# Patient Record
Sex: Male | Born: 1965
Health system: Southern US, Community
[De-identification: ages and names within clinical notes are randomized; demographics above are authoritative.]

## PROBLEM LIST (undated history)

## (undated) DIAGNOSIS — K219 Gastro-esophageal reflux disease without esophagitis: Secondary | ICD-10-CM

## (undated) DIAGNOSIS — E785 Hyperlipidemia, unspecified: Secondary | ICD-10-CM

## (undated) DIAGNOSIS — I10 Essential (primary) hypertension: Secondary | ICD-10-CM

## (undated) HISTORY — DX: Gastro-esophageal reflux disease without esophagitis: K21.9

## (undated) HISTORY — PX: HAND SURGERY: SHX662

## (undated) HISTORY — PX: APPENDECTOMY: SHX54

## (undated) HISTORY — DX: Hyperlipidemia, unspecified: E78.5

---

## 2005-09-28 ENCOUNTER — Emergency Department (HOSPITAL_COMMUNITY): Admission: EM | Admit: 2005-09-28 | Discharge: 2005-09-28 | Payer: Self-pay | Admitting: Emergency Medicine

## 2008-10-08 ENCOUNTER — Ambulatory Visit (HOSPITAL_COMMUNITY): Admission: RE | Admit: 2008-10-08 | Discharge: 2008-10-08 | Payer: Self-pay | Admitting: Preventative Medicine

## 2012-03-13 ENCOUNTER — Observation Stay (HOSPITAL_COMMUNITY)
Admission: EM | Admit: 2012-03-13 | Discharge: 2012-03-14 | Disposition: A | Discharge status: Home / Self Care | Payer: 59 | Attending: General Surgery | Admitting: General Surgery

## 2012-03-13 ENCOUNTER — Encounter (HOSPITAL_COMMUNITY): Payer: Self-pay

## 2012-03-13 ENCOUNTER — Emergency Department (HOSPITAL_COMMUNITY): Payer: 59

## 2012-03-13 ENCOUNTER — Encounter (HOSPITAL_COMMUNITY): Payer: Self-pay | Admitting: Anesthesiology

## 2012-03-13 ENCOUNTER — Emergency Department (HOSPITAL_COMMUNITY): Payer: 59 | Admitting: Anesthesiology

## 2012-03-13 ENCOUNTER — Encounter (HOSPITAL_COMMUNITY)
Admission: EM | Disposition: A | Discharge status: Home / Self Care | Payer: Self-pay | Source: Home / Self Care | Attending: General Surgery

## 2012-03-13 DIAGNOSIS — K37 Unspecified appendicitis: Secondary | ICD-10-CM

## 2012-03-13 DIAGNOSIS — R112 Nausea with vomiting, unspecified: Secondary | ICD-10-CM | POA: Insufficient documentation

## 2012-03-13 DIAGNOSIS — K3580 Unspecified acute appendicitis: Principal | ICD-10-CM | POA: Insufficient documentation

## 2012-03-13 DIAGNOSIS — R109 Unspecified abdominal pain: Secondary | ICD-10-CM | POA: Insufficient documentation

## 2012-03-13 HISTORY — PX: LAPAROSCOPIC APPENDECTOMY: SHX408

## 2012-03-13 LAB — URINALYSIS, ROUTINE W REFLEX MICROSCOPIC
Glucose, UA: NEGATIVE mg/dL
Hgb urine dipstick: NEGATIVE
Leukocytes, UA: NEGATIVE
Specific Gravity, Urine: >1.03 — ABNORMAL HIGH (ref 1.005–1.030)
Urobilinogen, UA: 0.2 mg/dL (ref 0.0–1.0)

## 2012-03-13 LAB — COMPREHENSIVE METABOLIC PANEL
ALT: 31 U/L (ref 0–53)
AST: 15 U/L (ref 0–37)
Calcium: 9.9 mg/dL (ref 8.4–10.5)
Creatinine, Ser: 0.68 mg/dL (ref 0.50–1.35)
Sodium: 139 mEq/L (ref 135–145)
Total Protein: 8.1 g/dL (ref 6.0–8.3)

## 2012-03-13 LAB — CBC
MCH: 30.1 pg (ref 26.0–34.0)
MCHC: 35.1 g/dL (ref 30.0–36.0)
MCV: 85.9 fL (ref 78.0–100.0)
Platelets: 237 10*3/uL (ref 150–400)
RDW: 12.3 % (ref 11.5–15.5)

## 2012-03-13 LAB — URINE MICROSCOPIC-ADD ON

## 2012-03-13 SURGERY — APPENDECTOMY, LAPAROSCOPIC
Anesthesia: General | Site: Abdomen | Wound class: Contaminated

## 2012-03-13 MED ORDER — SUCCINYLCHOLINE CHLORIDE 20 MG/ML IJ SOLN
INTRAMUSCULAR | Status: DC | PRN
  Administered 2012-03-13: 12 mg via INTRAVENOUS

## 2012-03-13 MED ORDER — HYDROMORPHONE HCL PF 1 MG/ML IJ SOLN
1.0000 mg | Freq: Once | INTRAMUSCULAR | Status: AC
  Administered 2012-03-13: 1 mg via INTRAVENOUS
  Filled 2012-03-13: qty 1

## 2012-03-13 MED ORDER — SODIUM CHLORIDE 0.9 % IV BOLUS (SEPSIS)
1000.0000 mL | Freq: Once | INTRAVENOUS | Status: AC
  Administered 2012-03-13: 1000 mL via INTRAVENOUS

## 2012-03-13 MED ORDER — ONDANSETRON HCL 4 MG/2ML IJ SOLN: 4.0000 mg | Freq: Four times a day (QID) | INTRAMUSCULAR | Status: DC | PRN

## 2012-03-13 MED ORDER — IOHEXOL 300 MG/ML  SOLN
100.0000 mL | Freq: Once | INTRAMUSCULAR | Status: AC | PRN
  Administered 2012-03-13: 100 mL via INTRAVENOUS

## 2012-03-13 MED ORDER — HYDROMORPHONE HCL PF 1 MG/ML IJ SOLN: 1.0000 mg | INTRAMUSCULAR | Status: DC | PRN

## 2012-03-13 MED ORDER — SODIUM CHLORIDE 0.9 % IV SOLN
1.0000 g | INTRAVENOUS | Status: AC
  Administered 2012-03-13: 1 g via INTRAVENOUS

## 2012-03-13 MED ORDER — ROCURONIUM BROMIDE 100 MG/10ML IV SOLN
INTRAVENOUS | Status: DC | PRN
  Administered 2012-03-13: 24 mg via INTRAVENOUS
  Administered 2012-03-13: 6 mg via INTRAVENOUS
  Administered 2012-03-13: 10 mg via INTRAVENOUS

## 2012-03-13 MED ORDER — LACTATED RINGERS IV SOLN
INTRAVENOUS | Status: DC
  Administered 2012-03-14: via INTRAVENOUS

## 2012-03-13 MED ORDER — ENOXAPARIN SODIUM 40 MG/0.4ML ~~LOC~~ SOLN
40.0000 mg | SUBCUTANEOUS | Status: DC
  Administered 2012-03-14: 40 mg via SUBCUTANEOUS
  Filled 2012-03-13: qty 0.4

## 2012-03-13 MED ORDER — ONDANSETRON HCL 4 MG/2ML IJ SOLN
4.0000 mg | Freq: Once | INTRAMUSCULAR | Status: AC
  Administered 2012-03-13: 4 mg via INTRAVENOUS
  Filled 2012-03-13: qty 2

## 2012-03-13 MED ORDER — LACTATED RINGERS IV SOLN
INTRAVENOUS | Status: DC | PRN
  Administered 2012-03-13: 20:00:00 via INTRAVENOUS

## 2012-03-13 MED ORDER — ACETAMINOPHEN 10 MG/ML IV SOLN
1000.0000 mg | Freq: Four times a day (QID) | INTRAVENOUS | Status: DC
  Administered 2012-03-13 – 2012-03-14 (×2): 1000 mg via INTRAVENOUS
  Filled 2012-03-13 (×2): qty 100

## 2012-03-13 MED ORDER — NEOSTIGMINE METHYLSULFATE 1 MG/ML IJ SOLN
INTRAMUSCULAR | Status: DC | PRN
  Administered 2012-03-13: 3 mg via INTRAVENOUS

## 2012-03-13 MED ORDER — BUPIVACAINE HCL (PF) 0.5 % IJ SOLN
INTRAMUSCULAR | Status: DC | PRN
  Administered 2012-03-13: 10 mL

## 2012-03-13 MED ORDER — GLYCOPYRROLATE 0.2 MG/ML IJ SOLN
INTRAMUSCULAR | Status: DC | PRN
  Administered 2012-03-13: .6 mg via INTRAVENOUS

## 2012-03-13 MED ORDER — ONDANSETRON HCL 4 MG PO TABS: 4.0000 mg | ORAL_TABLET | Freq: Four times a day (QID) | ORAL | Status: DC | PRN

## 2012-03-13 MED ORDER — SODIUM CHLORIDE 0.9 % IR SOLN
Status: DC | PRN
  Administered 2012-03-13: 1000 mL

## 2012-03-13 MED ORDER — SODIUM CHLORIDE 0.9 % IV SOLN
INTRAVENOUS | Status: AC
  Administered 2012-03-13: 20:00:00
  Filled 2012-03-13: qty 1

## 2012-03-13 MED ORDER — ONDANSETRON HCL 4 MG/2ML IJ SOLN
INTRAMUSCULAR | Status: DC | PRN
  Administered 2012-03-13: 4 mg via INTRAVENOUS

## 2012-03-13 MED ORDER — LIDOCAINE HCL 1 % IJ SOLN
INTRAMUSCULAR | Status: DC | PRN
  Administered 2012-03-13: 4 mg via INTRADERMAL

## 2012-03-13 MED ORDER — FENTANYL CITRATE 0.05 MG/ML IJ SOLN
INTRAMUSCULAR | Status: DC | PRN
  Administered 2012-03-13 (×5): 50 ug via INTRAVENOUS

## 2012-03-13 MED ORDER — PROPOFOL 10 MG/ML IV BOLUS
INTRAVENOUS | Status: DC | PRN
  Administered 2012-03-13: 150 mg via INTRAVENOUS

## 2012-03-13 SURGICAL SUPPLY — 45 items
BAG HAMPER (MISCELLANEOUS) ×2 IMPLANT
CLOTH BEACON ORANGE TIMEOUT ST (SAFETY) ×2 IMPLANT
COVER LIGHT HANDLE STERIS (MISCELLANEOUS) ×4 IMPLANT
CUTTER ENDO LINEAR 45M (STAPLE) IMPLANT
CUTTER LINEAR ENDO 35 ETS (STAPLE) ×2 IMPLANT
CUTTER LINEAR ENDO 35 ETS TH (STAPLE) IMPLANT
DECANTER SPIKE VIAL GLASS SM (MISCELLANEOUS) IMPLANT
DISSECTOR BLUNT TIP ENDO 5MM (MISCELLANEOUS) IMPLANT
DURAPREP 26ML APPLICATOR (WOUND CARE) ×2 IMPLANT
ELECT REM PT RETURN 9FT ADLT (ELECTROSURGICAL) ×2
ELECTRODE REM PT RTRN 9FT ADLT (ELECTROSURGICAL) ×1 IMPLANT
FILTER SMOKE EVAC LAPAROSHD (FILTER) ×2 IMPLANT
FORMALIN 10 PREFIL 120ML (MISCELLANEOUS) ×2 IMPLANT
GLOVE BIO SURGEON STRL SZ7.5 (GLOVE) ×2 IMPLANT
GLOVE BIOGEL PI IND STRL 7.0 (GLOVE) ×2 IMPLANT
GLOVE BIOGEL PI INDICATOR 7.0 (GLOVE) ×2
GLOVE EXAM NITRILE MD LF STRL (GLOVE) ×2 IMPLANT
GLOVE OPTIFIT SS 6.5 STRL BRWN (GLOVE) ×2 IMPLANT
GOWN STRL REIN XL XLG (GOWN DISPOSABLE) ×6 IMPLANT
INST SET LAPROSCOPIC AP (KITS) ×2 IMPLANT
IV NS IRRIG 3000ML ARTHROMATIC (IV SOLUTION) IMPLANT
KIT ROOM TURNOVER APOR (KITS) ×2 IMPLANT
MANIFOLD NEPTUNE II (INSTRUMENTS) ×2 IMPLANT
NEEDLE INSUFFLATION 14GA 120MM (NEEDLE) ×2 IMPLANT
NS IRRIG 1000ML POUR BTL (IV SOLUTION) ×2 IMPLANT
PACK LAP CHOLE LZT030E (CUSTOM PROCEDURE TRAY) ×2 IMPLANT
PAD ARMBOARD 7.5X6 YLW CONV (MISCELLANEOUS) ×2 IMPLANT
PENCIL HANDSWITCHING (ELECTRODE) ×2 IMPLANT
POUCH SPECIMEN RETRIEVAL 10MM (ENDOMECHANICALS) ×2 IMPLANT
RELOAD /EVU35 (ENDOMECHANICALS) IMPLANT
RELOAD 45 VASCULAR/THIN (ENDOMECHANICALS) IMPLANT
RELOAD CUTTER ETS 35MM STAND (ENDOMECHANICALS) IMPLANT
SCALPEL HARMONIC ACE (MISCELLANEOUS) ×2 IMPLANT
SET BASIN LINEN APH (SET/KITS/TRAYS/PACK) ×2 IMPLANT
SET TUBE IRRIG SUCTION NO TIP (IRRIGATION / IRRIGATOR) IMPLANT
SPONGE GAUZE 2X2 8PLY STRL LF (GAUZE/BANDAGES/DRESSINGS) ×6 IMPLANT
STAPLER VISISTAT (STAPLE) ×2 IMPLANT
SUT VICRYL 0 UR6 27IN ABS (SUTURE) ×2 IMPLANT
TAPE CLOTH SURG 4X10 WHT LF (GAUZE/BANDAGES/DRESSINGS) ×2 IMPLANT
TRAY FOLEY CATH 14FR (SET/KITS/TRAYS/PACK) ×2 IMPLANT
TROCAR Z-THAD FIOS HNDL 12X100 (TROCAR) ×2 IMPLANT
TROCAR Z-THRD FIOS HNDL 11X100 (TROCAR) ×2 IMPLANT
TROCAR Z-THREAD FIOS 5X100MM (TROCAR) ×2 IMPLANT
WARMER LAPAROSCOPE (MISCELLANEOUS) ×2 IMPLANT
YANKAUER SUCT BULB TIP 10FT TU (MISCELLANEOUS) ×2 IMPLANT

## 2012-03-13 NOTE — Transfer of Care (Signed)
Immediate Anesthesia Transfer of Care Note  Patient: Collin Hernandez  Procedure(s) Performed: Procedure(s) (LRB): APPENDECTOMY LAPAROSCOPIC (N/A)  Patient Location: PACU  Anesthesia Type: General  Level of Consciousness: awake, alert  and oriented  Airway & Oxygen Therapy: Patient Spontanous Breathing  Post-op Assessment: Report given to PACU RN  Post vital signs: Reviewed and stable  Complications: No apparent anesthesia complications

## 2012-03-13 NOTE — ED Notes (Signed)
Pt c/o lower abd pain since last night.  Reports vomited x 6 since last night.  Denies diarrhea.  LBM was noon today and was normal per pt.

## 2012-03-13 NOTE — Anesthesia Procedure Notes (Signed)
Procedure Name: Intubation Date/Time: 03/13/2012 8:24 PM Performed by: Glynn Octave E Pre-anesthesia Checklist: Patient identified, Patient being monitored, Timeout performed, Emergency Drugs available and Suction available Patient Re-evaluated:Patient Re-evaluated prior to inductionOxygen Delivery Method: Circle System Utilized Preoxygenation: Pre-oxygenation with 100% oxygen Intubation Type: IV induction, Rapid sequence and Cricoid Pressure applied Ventilation: Mask ventilation without difficulty Laryngoscope Size: Mac and 3 Grade View: Grade I Tube type: Oral Tube size: 7.0 mm Number of attempts: 1 Airway Equipment and Method: stylet Placement Confirmation: ETT inserted through vocal cords under direct vision,  positive ETCO2 and breath sounds checked- equal and bilateral Secured at: 21 cm Tube secured with: Tape Dental Injury: Teeth and Oropharynx as per pre-operative assessment

## 2012-03-13 NOTE — H&P (Signed)
Collin Hernandez is an 46 y.o. male.   Chief Complaint: right lower quadrant abdominal pain HPI: patient is a 46 year old Hispanic male who presents with a less than 24-hour history of worseningright lower quadrant abdominal pain. CT scan the abdomen and pelvis reveals acute appendicitis. Surgery has been consulted.  History reviewed. No pertinent past medical history.  History reviewed. No pertinent past surgical history.  No family history on file. Social History:  reports that he has never smoked. He does not have any smokeless tobacco history on file. He reports that he drinks alcohol. He reports that he does not use illicit drugs.  Allergies: No Known Allergies   (Not in a hospital admission)  Results for orders placed during the hospital encounter of 03/13/12 (from the past 48 hour(s))  CBC     Status: Abnormal   Collection Time   03/13/12  5:40 PM      Component Value Range Comment   WBC 15.9 (*) 4.0 - 10.5 K/uL    RBC 4.81  4.22 - 5.81 MIL/uL    Hemoglobin 14.5  13.0 - 17.0 g/dL    HCT 78.2  95.6 - 21.3 %    MCV 85.9  78.0 - 100.0 fL    MCH 30.1  26.0 - 34.0 pg    MCHC 35.1  30.0 - 36.0 g/dL    RDW 08.6  57.8 - 46.9 %    Platelets 237  150 - 400 K/uL   COMPREHENSIVE METABOLIC PANEL     Status: Abnormal   Collection Time   03/13/12  5:40 PM      Component Value Range Comment   Sodium 139  135 - 145 mEq/L    Potassium 3.9  3.5 - 5.1 mEq/L    Chloride 100  96 - 112 mEq/L    CO2 28  19 - 32 mEq/L    Glucose, Bld 143 (*) 70 - 99 mg/dL    BUN 11  6 - 23 mg/dL    Creatinine, Ser 6.29  0.50 - 1.35 mg/dL    Calcium 9.9  8.4 - 52.8 mg/dL    Total Protein 8.1  6.0 - 8.3 g/dL    Albumin 4.5  3.5 - 5.2 g/dL    AST 15  0 - 37 U/L    ALT 31  0 - 53 U/L    Alkaline Phosphatase 88  39 - 117 U/L    Total Bilirubin 0.8  0.3 - 1.2 mg/dL    GFR calc non Af Amer >90  >90 mL/min    GFR calc Af Amer >90  >90 mL/min   LIPASE, BLOOD     Status: Normal   Collection Time   03/13/12  5:40 PM       Component Value Range Comment   Lipase 28  11 - 59 U/L   URINALYSIS, ROUTINE W REFLEX MICROSCOPIC     Status: Abnormal   Collection Time   03/13/12  6:49 PM      Component Value Range Comment   Color, Urine AMBER (*) YELLOW BIOCHEMICALS MAY BE AFFECTED BY COLOR   APPearance CLEAR  CLEAR    Specific Gravity, Urine >1.030 (*) 1.005 - 1.030    pH 6.0  5.0 - 8.0    Glucose, UA NEGATIVE  NEGATIVE mg/dL    Hgb urine dipstick NEGATIVE  NEGATIVE    Bilirubin Urine NEGATIVE  NEGATIVE    Ketones, ur >80 (*) NEGATIVE mg/dL    Protein, ur TRACE (*) NEGATIVE  mg/dL    Urobilinogen, UA 0.2  0.0 - 1.0 mg/dL    Nitrite NEGATIVE  NEGATIVE    Leukocytes, UA NEGATIVE  NEGATIVE   URINE MICROSCOPIC-ADD ON     Status: Abnormal   Collection Time   03/13/12  6:49 PM      Component Value Range Comment   Squamous Epithelial / LPF RARE  RARE    WBC, UA 0-2  <3 WBC/hpf    RBC / HPF 0-2  <3 RBC/hpf    Bacteria, UA FEW (*) RARE    Ct Abdomen Pelvis W Contrast  03/13/2012  *RADIOLOGY REPORT*  Clinical Data: Right lower quadrant pain.  Chills.  CT ABDOMEN AND PELVIS WITH CONTRAST  Technique:  Multidetector CT imaging of the abdomen and pelvis was performed following the standard protocol during bolus administration of intravenous contrast.  Contrast: OMNIPAQUE IOHEXOL 300 MG/ML  SOLN  Comparison: None.  Findings: Findings consistent with appendicitis with the appendix containing two stones measuring up to 7.4 mm with remainder the appendix dilated with surrounding inflammation.  No free intraperitoneal air. Small amount of free fluid in the pelvis.  No well-defined drainable abscess.  Under distended colon from the level of the distal transverse colon to the rectum.  Evaluation of this area is limited ( with respect to excluding colitis) but without definitive extraluminal bowel inflammatory process referable to this region.  Fatty liver without focal hepatic lesion.  No calcified gallstones.  No focal splenic,  pancreatic, adrenal or renal lesion.  Lung bases clear.  No abdominal aortic aneurysm.  No adenopathy.  No bony destructive lesion.  Decompressed noncontrast filled urinary bladder without gross abnormality.  IMPRESSION: Findings consistent with appendicitis as detailed above.  Critical Value/emergent results were called by telephone at the time of interpretation on 03/13/2012 at 6:20 p.m. to Dr. Patria Mane, who verbally acknowledged these results.  Original Report Authenticated By: Fuller Canada, M.D.    Review of Systems  Constitutional: Positive for malaise/fatigue.  HENT: Negative.   Eyes: Negative.   Respiratory: Negative.   Cardiovascular: Negative.   Gastrointestinal: Positive for nausea and abdominal pain.  Genitourinary: Negative.   Musculoskeletal: Negative.   Skin: Negative.   Neurological: Negative.   Endo/Heme/Allergies: Negative.   Psychiatric/Behavioral: Negative.     Blood pressure 131/76, pulse 78, temperature 97.4 F (36.3 C), temperature source Oral, resp. rate 18, height 5\' 9"  (1.753 m), weight 81.647 kg (180 lb), SpO2 100.00%. Physical Exam  Constitutional: He is oriented to person, place, and time. He appears well-developed and well-nourished.  HENT:  Head: Normocephalic and atraumatic.  Neck: Normal range of motion. Neck supple.  Cardiovascular: Normal rate, regular rhythm and normal heart sounds.   Respiratory: Effort normal and breath sounds normal.  GI: Soft. There is tenderness. There is rebound.       Tender in right lower quadrant to palpation.  No rigidity noted.  Neurological: He is alert and oriented to person, place, and time.  Skin: Skin is warm and dry.  Psychiatric: He has a normal mood and affect. His behavior is normal. Judgment and thought content normal.     Assessment/Plan Impression: Acute appendicitis Plan: Patient will be taken to the operating room for laparoscopic appendectomy. The risks and benefits of the procedure including bleeding,  infection, and the possibility of an open procedure were fully explained to the patient, gave informed consent.  Vivica Dobosz A 03/13/2012, 7:38 PM

## 2012-03-13 NOTE — ED Notes (Signed)
Dr Lovell Sheehan here to eval pt for surgery

## 2012-03-13 NOTE — Anesthesia Preprocedure Evaluation (Addendum)
Anesthesia Evaluation  Patient identified by MRN, date of birth, ID band Patient awake    Reviewed: Allergy & Precautions, H&P , NPO status , Patient's Chart, lab work & pertinent test results  Airway Mallampati: II TM Distance: >3 FB Neck ROM: Full    Dental  (+) Teeth Intact and Dental Advisory Given   Pulmonary neg pulmonary ROS,  breath sounds clear to auscultation  Pulmonary exam normal       Cardiovascular Exercise Tolerance: Good Rhythm:Regular Rate:Normal     Neuro/Psych    GI/Hepatic   Endo/Other    Renal/GU      Musculoskeletal   Abdominal   Peds  Hematology   Anesthesia Other Findings   Reproductive/Obstetrics                          Anesthesia Physical Anesthesia Plan  ASA: I and Emergent  Anesthesia Plan: General   Post-op Pain Management:    Induction: Intravenous, Rapid sequence and Cricoid pressure planned  Airway Management Planned: Oral ETT  Additional Equipment:   Intra-op Plan:   Post-operative Plan: Extubation in OR  Informed Consent: I have reviewed the patients History and Physical, chart, labs and discussed the procedure including the risks, benefits and alternatives for the proposed anesthesia with the patient or authorized representative who has indicated his/her understanding and acceptance.     Plan Discussed with: Anesthesiologist  Anesthesia Plan Comments:         Anesthesia Quick Evaluation

## 2012-03-13 NOTE — Anesthesia Postprocedure Evaluation (Signed)
  Anesthesia Post-op Note  Patient: Collin Hernandez  Procedure(s) Performed: Procedure(s) (LRB): APPENDECTOMY LAPAROSCOPIC (N/A)  Patient Location: PACU  Anesthesia Type: General  Level of Consciousness: awake, alert  and oriented  Airway and Oxygen Therapy: Patient Spontanous Breathing and Patient connected to face mask oxygen  Post-op Pain: mild  Post-op Assessment: Post-op Vital signs reviewed, Patient's Cardiovascular Status Stable, Respiratory Function Stable, Patent Airway and No signs of Nausea or vomiting  Post-op Vital Signs: Reviewed and stable  Complications: No apparent anesthesia complications

## 2012-03-13 NOTE — ED Provider Notes (Signed)
History   This chart was scribed for Lyanne Co, MD by Charolett Bumpers . The patient was seen in room APA03/APA03. Patient's care was started at 1722.    CSN: [id number]  Arrival date & time 03/13/12  1648   First MD Initiated Contact with Patient 03/13/12 1722      Chief Complaint  Patient presents with  . Abdominal Pain    (Consider location/radiation/quality/duration/timing/severity/associated sxs/prior treatment) HPI Collin Hernandez is a 46 y.o. male who presents to the Emergency Department complaining of constant, moderate lower abdominal pain that started last night. Pt reports associated lower back pain, chills, nausea and vomiting, with vomiting X6 times last night. Patient denies any diarrhea, dysuria, increased urinary frequency, or penile discharge. Pt denies any fevers. Pt states that his last normal BM was at noon today. Pt denies any h/o similar symptoms. Pt denies any abdominal surgeries.    History reviewed. No pertinent past medical history.  History reviewed. No pertinent past surgical history.  No family history on file.  History  Substance Use Topics  . Smoking status: Never Smoker   . Smokeless tobacco: Not on file  . Alcohol Use: Yes     occasionally      Review of Systems A complete 10 system review of systems was obtained and all systems are negative except as noted in the HPI and PMH.   Allergies  Review of patient's allergies indicates no known allergies.  Home Medications   Current Outpatient Rx  Name Route Sig Dispense Refill  . BISACODYL 5 MG PO TBEC Oral Take 15 mg by mouth once as needed. For relief      BP 131/76  Pulse 78  Temp 97.4 F (36.3 C) (Oral)  Resp 18  Ht 5\' 9"  (1.753 m)  Wt 180 lb (81.647 kg)  BMI 26.58 kg/m2  SpO2 100%  Physical Exam  Nursing note and vitals reviewed. Constitutional: He is oriented to person, place, and time. He appears well-developed and well-nourished. No distress.  HENT:  Head:  Normocephalic and atraumatic.  Eyes: EOM are normal. Pupils are equal, round, and reactive to light.  Neck: Normal range of motion. Neck supple. No tracheal deviation present.  Cardiovascular: Normal rate, regular rhythm and normal heart sounds.   Pulmonary/Chest: Effort normal and breath sounds normal. No respiratory distress. He has no wheezes.  Abdominal: Soft. Bowel sounds are normal. He exhibits no distension. There is tenderness in the right lower quadrant. There is no rebound and no guarding.       Tenderness in RLQ.  Musculoskeletal: Normal range of motion. He exhibits no edema.  Neurological: He is alert and oriented to person, place, and time. No sensory deficit.  Skin: Skin is warm and dry.  Psychiatric: He has a normal mood and affect. His behavior is normal.    ED Course  Procedures (including critical care time)  DIAGNOSTIC STUDIES: Oxygen Saturation is 100% on room air, normal by my interpretation.    COORDINATION OF CARE:  17:30-Medication Orders: Sodium chloride 0.9% bolus 1,000 mL-once; Ondansetron (Zofran) injection 4 mg-once  17:44-Discussed planned course of treatment with the patient including IV fluids, pain medication and CT scan of abdomen, who is agreeable at this time.   17:45-Medication Orders: Hydromorphone (Dilaudid) injection 1 mg-once.   18:30-Recheck: Informed patient of appendicitis dx and results.   Results for orders placed during the hospital encounter of 03/13/12  CBC      Component Value Range   WBC 15.9 (*)  4.0 - 10.5 K/uL   RBC 4.81  4.22 - 5.81 MIL/uL   Hemoglobin 14.5  13.0 - 17.0 g/dL   HCT 91.4  78.2 - 95.6 %   MCV 85.9  78.0 - 100.0 fL   MCH 30.1  26.0 - 34.0 pg   MCHC 35.1  30.0 - 36.0 g/dL   RDW 21.3  08.6 - 57.8 %   Platelets 237  150 - 400 K/uL  COMPREHENSIVE METABOLIC PANEL      Component Value Range   Sodium 139  135 - 145 mEq/L   Potassium 3.9  3.5 - 5.1 mEq/L   Chloride 100  96 - 112 mEq/L   CO2 28  19 - 32 mEq/L    Glucose, Bld 143 (*) 70 - 99 mg/dL   BUN 11  6 - 23 mg/dL   Creatinine, Ser 4.69  0.50 - 1.35 mg/dL   Calcium 9.9  8.4 - 62.9 mg/dL   Total Protein 8.1  6.0 - 8.3 g/dL   Albumin 4.5  3.5 - 5.2 g/dL   AST 15  0 - 37 U/L   ALT 31  0 - 53 U/L   Alkaline Phosphatase 88  39 - 117 U/L   Total Bilirubin 0.8  0.3 - 1.2 mg/dL   GFR calc non Af Amer >90  >90 mL/min   GFR calc Af Amer >90  >90 mL/min  LIPASE, BLOOD      Component Value Range   Lipase 28  11 - 59 U/L     Ct Abdomen Pelvis W Contrast  03/13/2012  *RADIOLOGY REPORT*  Clinical Data: Right lower quadrant pain.  Chills.  CT ABDOMEN AND PELVIS WITH CONTRAST  Technique:  Multidetector CT imaging of the abdomen and pelvis was performed following the standard protocol during bolus administration of intravenous contrast.  Contrast: OMNIPAQUE IOHEXOL 300 MG/ML  SOLN  Comparison: None.  Findings: Findings consistent with appendicitis with the appendix containing two stones measuring up to 7.4 mm with remainder the appendix dilated with surrounding inflammation.  No free intraperitoneal air. Small amount of free fluid in the pelvis.  No well-defined drainable abscess.  Under distended colon from the level of the distal transverse colon to the rectum.  Evaluation of this area is limited ( with respect to excluding colitis) but without definitive extraluminal bowel inflammatory process referable to this region.  Fatty liver without focal hepatic lesion.  No calcified gallstones.  No focal splenic, pancreatic, adrenal or renal lesion.  Lung bases clear.  No abdominal aortic aneurysm.  No adenopathy.  No bony destructive lesion.  Decompressed noncontrast filled urinary bladder without gross abnormality.  IMPRESSION: Findings consistent with appendicitis as detailed above.  Critical Value/emergent results were called by telephone at the time of interpretation on 03/13/2012 at 6:20 p.m. to Dr. Patria Mane, who verbally acknowledged these results.  Original  Report Authenticated By: Fuller Canada, M.D.    I personally reviewed the imaging tests through PACS system  I reviewed available ER/hospitalization records thought the EMR    1. Appendicitis       MDM  The patient presents with right lower quadrant abdominal pain consistent with acute appendicitis on CT scan.  White count 16,000.  N.p.o. on arrival to the emergency department but his last drink was at 4:30 PM.  I discussed the case with Dr. Lovell Sheehan who will by with the patient emergency department  I personally performed the services described in this documentation, which was scribed in my  presence. The recorded information has been reviewed and considered.          Lyanne Co, MD 03/13/12 [phone]

## 2012-03-13 NOTE — Op Note (Signed)
Patient:  Collin Hernandez  DOB:  June 27, 1966  MRN:  [id number]   Preop Diagnosis:  Acute appendicitis  Postop Diagnosis:  Same  Procedure:  Laparoscopic appendectomy  Surgeon:  Franky Macho, M.D.  Anes:  General endotracheal  Indications:  Patient is a 46 year old Hispanic male who presents with a less than 24-hour history of worsening lower abdominal pain. CT scan the abdomen reveals acute appendicitis. The risks and benefits of the procedure including bleeding, infection, the possibly of an open procedure were fully explained to the patient, gave informed consent.  Procedure note:  Patient is placed the supine position. After induction of general endotracheal anesthesia, the abdomen was prepped and draped using usual sterile technique with DuraPrep. Surgical site confirmation was performed.  A supraumbilical incision was made down to the fascia. A Veress needle was introduced into the abdominal cavity and confirmation of placement was done using the saline drop test. The abdomen was then insufflated to 16 mm mercury pressure. An 11 mm trocar was introduced into the abdominal cavity under direct visualization without difficulty. The patient was placed in deeper Trendelenburg position additional 12 mm trocar was placed the suprapubic region and a 5 mm trocar was placed in the left lower quadrant region. The appendix was visualized and was noted to be significantly distended. No perforation was noted. The base the appendix appeared normal. The mesoappendix was divided using the harmonic scalpel. A standard Endo GIA was placed across the base of the appendix and fired. The appendix was then removed using an Endo Catch bag without difficulty. The appendix was sent to pathology for further examination. The staple line was inspected and noted to be within normal limits. The right lower quadrant was evacuated of all fluid and air. All trochars were then removed. All wounds were injected with 0.5%  Sensorcaine. The supraumbilical fascia as well as suprapubic fascia reapproximated using 0 Vicryl interrupted sutures. All skin incisions were closed using staples. Betadine ointment after dressings were applied.  All tape and needle counts were correct at the end of the procedure. Patient was extubated in the operating room and went back to recovery room awake in stable condition.    Complications:  None  EBL:  Minimal  Specimen:  Appendix

## 2012-03-14 LAB — CBC
MCV: 87.4 fL (ref 78.0–100.0)
Platelets: 216 10*3/uL (ref 150–400)
RBC: 4.37 MIL/uL (ref 4.22–5.81)
WBC: 9.9 10*3/uL (ref 4.0–10.5)

## 2012-03-14 MED ORDER — OXYCODONE-ACETAMINOPHEN 7.5-325 MG PO TABS: 1.0000 | ORAL_TABLET | ORAL | Status: DC | PRN

## 2012-03-14 MED ORDER — OXYCODONE-ACETAMINOPHEN 5-325 MG PO TABS
2.0000 | ORAL_TABLET | Freq: Once | ORAL | Status: AC
  Administered 2012-03-14: 2 via ORAL
  Filled 2012-03-14: qty 2

## 2012-03-14 NOTE — Progress Notes (Signed)
UR Chart Review Completed  

## 2012-03-14 NOTE — Anesthesia Postprocedure Evaluation (Signed)
  Anesthesia Post-op Note  Patient: Collin Hernandez  Procedure(s) Performed: Procedure(s) (LRB): APPENDECTOMY LAPAROSCOPIC (N/A)  Patient Location: room 203  Anesthesia Type: General  Level of Consciousness: awake, alert , oriented and patient cooperative  Airway and Oxygen Therapy: Patient Spontanous Breathing  Post-op Pain: none  Post-op Assessment: Post-op Vital signs reviewed, Patient's Cardiovascular Status Stable, Respiratory Function Stable, RESPIRATORY FUNCTION UNSTABLE, Patent Airway and Pain level controlled  Post-op Vital Signs: Reviewed and stable  Complications: No apparent anesthesia complications

## 2012-03-14 NOTE — Discharge Summary (Signed)
Physician Discharge Summary  Patient ID: Collin Hernandez MRN: [id number] DOB/AGE: [birth year] 46 y.o.  Admit date: 03/13/2012 Discharge date: 03/14/2012  Admission Diagnoses: Acute appendicitis  Discharge Diagnoses: Same Active Problems:  * No active hospital problems. *    Discharged Condition: good  Hospital Course: Patient is a 46 year old Hispanic male who presented emergency room with lower abdominal pain. A CT scan of the abdomen revealed acute appendicitis. He was taken to the operating room on 03/13/2012 underwent a laparoscopic appendectomy. Tolerated the procedure well. His postoperative course has been unremarkable. His diet was advanced without difficulty.  He is being discharged home in good improving condition.  Significant Diagnostic Studies: radiology: CT scan: Acute appendicitis  Treatments: surgery: Laparoscopic appendectomy on 03/13/2012  Discharge Exam: Blood pressure 96/62, pulse 75, temperature 98.8 F (37.1 C), temperature source Oral, resp. rate 18, height 5\' 9"  (1.753 m), weight 81.647 kg (180 lb), SpO2 94.00%. General appearance: alert and cooperative Resp: clear to auscultation bilaterally Cardio: regular rate and rhythm, S1, S2 normal, no murmur, click, rub or gallop GI: Soft, flat. Dressings dry and intact.  Disposition: Home  Medication List  As of 03/14/2012  8:27 AM   STOP taking these medications         bisacodyl 5 MG EC tablet         TAKE these medications         oxyCODONE-acetaminophen 7.5-325 MG per tablet   Commonly known as: PERCOCET   Take 1-2 tablets by mouth every 4 (four) hours as needed for pain.           Follow-up Information    Follow up with Dalia Heading, MD. Schedule an appointment as soon as possible for a visit on 03/20/2012.   Contact information:   [address] [place] [address] [phone]          Signed: Dalia Heading 03/14/2012, 8:27 AM

## 2012-03-14 NOTE — Progress Notes (Signed)
D/c instructions reviewed with patient and family.  Verbalized understanding. Pt dc'd to home with family. Schonewitz, Candelaria Stagers  03/14/2012

## 2012-03-14 NOTE — Addendum Note (Signed)
Addendum  created 03/14/12 1040 by Despina Hidden, CRNA   Modules edited:Notes Section

## 2012-03-16 ENCOUNTER — Encounter (HOSPITAL_COMMUNITY): Payer: Self-pay | Admitting: General Surgery

## 2012-05-13 ENCOUNTER — Emergency Department (HOSPITAL_COMMUNITY)
Admission: EM | Admit: 2012-05-13 | Discharge: 2012-05-13 | Disposition: A | Discharge status: Home / Self Care | Payer: 59 | Attending: Emergency Medicine | Admitting: Emergency Medicine

## 2012-05-13 ENCOUNTER — Encounter (HOSPITAL_COMMUNITY): Payer: Self-pay | Admitting: *Deleted

## 2012-05-13 DIAGNOSIS — E119 Type 2 diabetes mellitus without complications: Secondary | ICD-10-CM | POA: Insufficient documentation

## 2012-05-13 DIAGNOSIS — R51 Headache: Secondary | ICD-10-CM | POA: Insufficient documentation

## 2012-05-13 DIAGNOSIS — R002 Palpitations: Secondary | ICD-10-CM | POA: Insufficient documentation

## 2012-05-13 DIAGNOSIS — R739 Hyperglycemia, unspecified: Secondary | ICD-10-CM

## 2012-05-13 DIAGNOSIS — I10 Essential (primary) hypertension: Secondary | ICD-10-CM | POA: Insufficient documentation

## 2012-05-13 HISTORY — DX: Essential (primary) hypertension: I10

## 2012-05-13 LAB — GLUCOSE, CAPILLARY: Glucose-Capillary: 111 mg/dL — ABNORMAL HIGH (ref 70–99)

## 2012-05-13 MED ORDER — ACETAMINOPHEN 500 MG PO TABS
1000.0000 mg | ORAL_TABLET | Freq: Once | ORAL | Status: AC
  Administered 2012-05-13: 1000 mg via ORAL
  Filled 2012-05-13: qty 2

## 2012-05-13 NOTE — ED Notes (Signed)
Pt reports "just feeling funny" since he was told a few days ago his blood sugar was high.  Reporting mild headache as well.  VS rechecked, and were stable.  Blood sugar checked, results 111

## 2012-05-13 NOTE — ED Notes (Signed)
Pt c/o having a headache, tachycardic, and diaphoretic around 30 mins ago. Pt had a physical earlier this week and was told he was diabetic and his blood sugar was high.

## 2012-05-13 NOTE — ED Provider Notes (Signed)
History     CSN: [id number]  Arrival date & time 05/13/12  0038   First MD Initiated Contact with Patient 05/13/12 0150      Chief Complaint  Patient presents with  . Headache  . Tachycardia    (Consider location/radiation/quality/duration/timing/severity/associated sxs/prior treatment) HPI  Collin Hernandez is a 46 y.o. male who presents to the Emergency Department complaining of intermittent sharp headache to the left side of his head that began yesterday, occasional rapid heart rate, and recent diagnosis of elevated glucose. He was seen this week and had a physical exam which was normal. Labs returned yesterday with elevated glucose. He has been worrying since. Denies urinary frequency, recent weight loss, fever, chills, cough, shortness of breath.  [facility]  Past Medical History  Diagnosis Date  . Hypertension   . Diabetes mellitus     Past Surgical History  Procedure Date  . Left arm surgery   . Laparoscopic appendectomy 03/13/2012    Procedure: APPENDECTOMY LAPAROSCOPIC;  Surgeon: Dalia Heading, MD;  Location: [facility];  Service: General;  Laterality: N/A;  . Appendectomy     No family history on file.  History  Substance Use Topics  . Smoking status: Never Smoker   . Smokeless tobacco: Not on file  . Alcohol Use: Yes     occasionally      Review of Systems  Constitutional: Negative for fever.       10 Systems reviewed and are negative for acute change except as noted in the HPI.  HENT: Negative for congestion.   Eyes: Negative for discharge and redness.  Respiratory: Negative for cough and shortness of breath.   Cardiovascular: Positive for palpitations. Negative for chest pain.  Gastrointestinal: Negative for vomiting and abdominal pain.  Musculoskeletal: Negative for back pain.  Skin: Negative for rash.  Neurological: Positive for headaches. Negative for syncope and numbness.  Psychiatric/Behavioral:       No behavior change.    Allergies    Review of patient's allergies indicates no known allergies.  Home Medications  No current outpatient prescriptions on file.  BP 142/84  Pulse 68  Temp 97.9 F (36.6 C)  Resp 18  Wt 183 lb (83.008 kg)  SpO2 98%  Physical Exam  Nursing note and vitals reviewed. Constitutional: He is oriented to person, place, and time. He appears well-developed and well-nourished.       Awake, alert, nontoxic appearance.  HENT:  Head: Atraumatic.  Eyes: Right eye exhibits no discharge. Left eye exhibits no discharge.  Neck: Neck supple.  Cardiovascular: Normal heart sounds.   Pulmonary/Chest: Effort normal and breath sounds normal. He exhibits no tenderness.  Abdominal: Soft. Bowel sounds are normal. There is no tenderness. There is no rebound.  Musculoskeletal: Normal range of motion. He exhibits no tenderness.       Baseline ROM, no obvious new focal weakness.  Neurological: He is alert and oriented to person, place, and time.       Mental status and motor strength appears baseline for patient and situation.  Skin: No rash noted.  Psychiatric: He has a normal mood and affect.    ED Course  Procedures (including critical care time) Results for orders placed during the hospital encounter of 05/13/12  GLUCOSE, CAPILLARY      Component Value Range   Glucose-Capillary 111 (*) 70 - 99 mg/dL     Date: 29/56/2130  8657  Rate: 66  Rhythm: normal sinus rhythm  QRS Axis: normal  Intervals:  normal  ST/T Wave abnormalities : normal  Conduction Disutrbances: none  Narrative Interpretation: unremarkable  MDM Reviewed: nursing note and vitals Interpretation: labs and ECG           MDM  Patient with a variety of complaints as a result of a recent physical exam. He is worried that his sugars are too high, he is having intermittent stabbing pain to the left side of his head and he has experienced palpitations. EKG is unremarkable. Glucose is slightly elevated. Dx testing d/w pt.Questions  answered.  Verb understanding, agreeable to d/c home with outpt f/u. Pt feels improved after observation and/or treatment in ED.Pt stable in ED with no significant deterioration in condition.The patient appears reasonably screened and/or stabilized for discharge and I doubt any other medical condition or other [facility] requiring further screening, evaluation, or treatment in the ED at this time prior to discharge.         Nicoletta Dress. Colon Branch, MD 05/13/12 [phone]

## 2014-03-04 IMAGING — CT CT ABD-PELV W/ CM
2 of 4 series · 16 of 46 positions shown, 18 images · IV contrast (omnipaque)
Comparison: None.

CLINICAL DATA: Right lower quadrant pain.  Chills.

CT ABDOMEN AND PELVIS WITH CONTRAST
TECHNIQUE: Multidetector CT imaging of the abdomen and pelvis was
performed following the standard protocol during bolus
administration of intravenous contrast.
Contrast: 100mL OMNIPAQUE IOHEXOL 300 MG/ML  SOLN

[Series 2: abd_pel_with 5.0 b40f · axial · 0.66mm/px · z∈[-424,+20]mm · 13 of 99 slices shown, 15 images]
[im 5/99  soft-tissue]
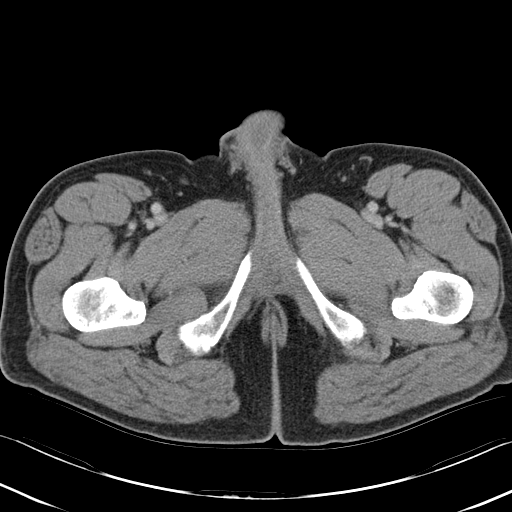
[im 5/99  bone]
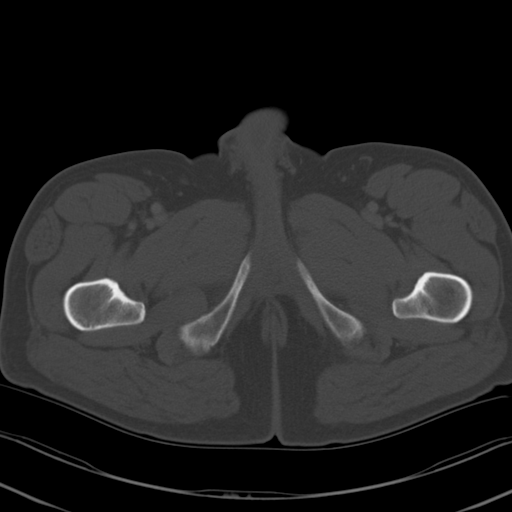
[im 15/99  soft-tissue]
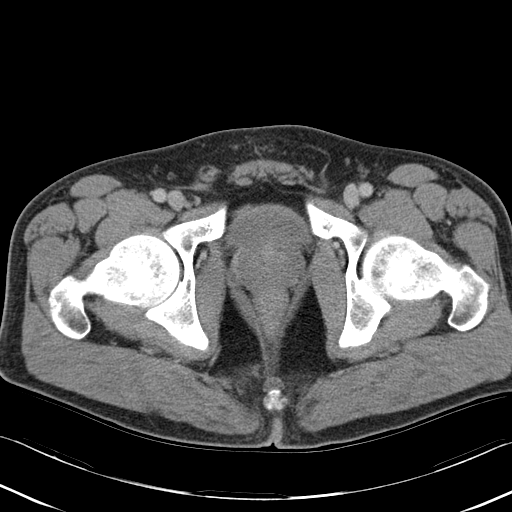
[im 20/99  soft-tissue]
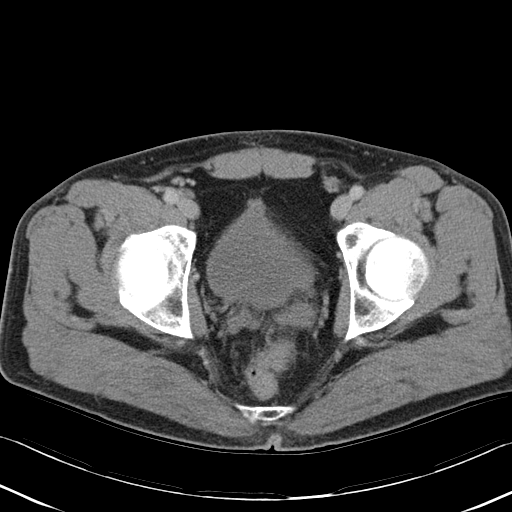
[im 30/99  soft-tissue]
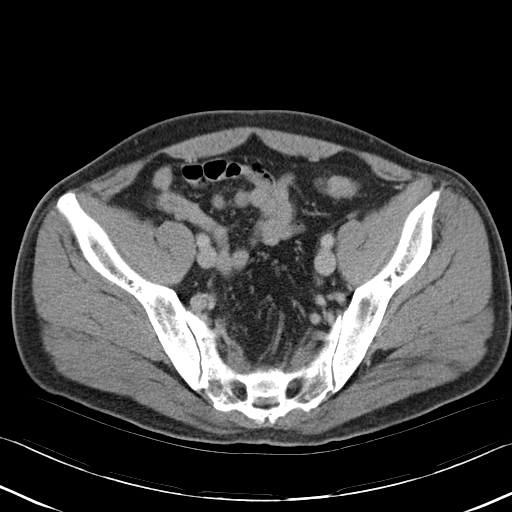
[im 35/99  soft-tissue]
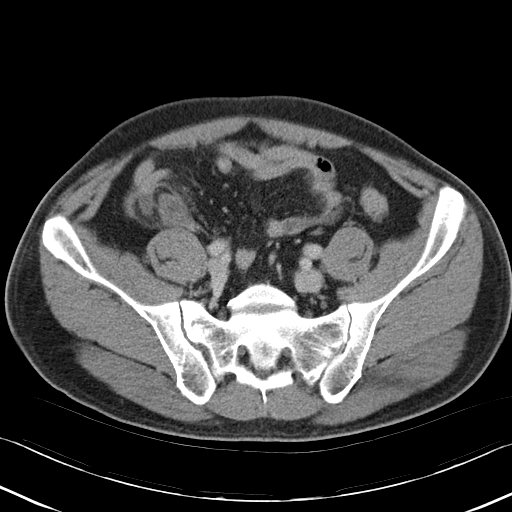
[im 45/99  soft-tissue]
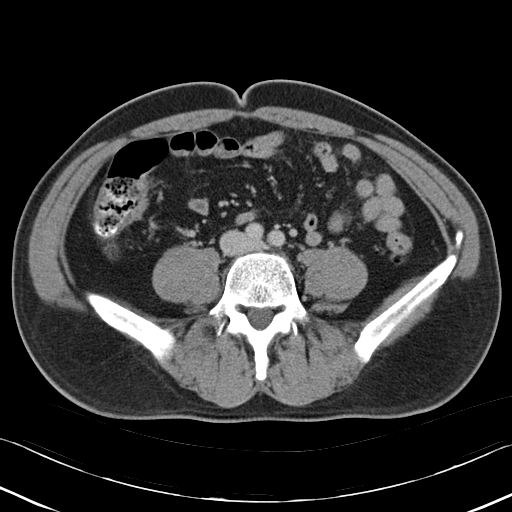
[im 50/99  soft-tissue]
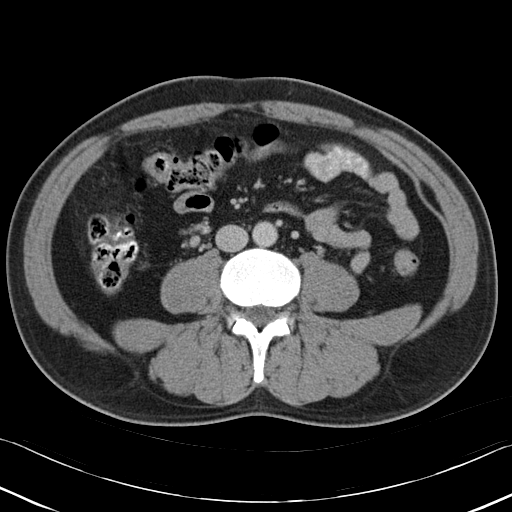
[im 54/99  soft-tissue]
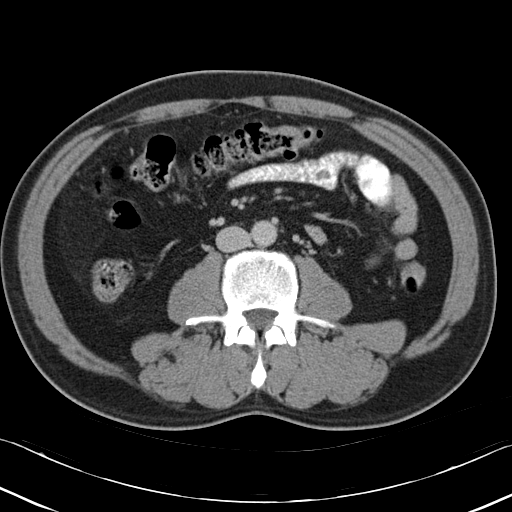
[im 64/99  soft-tissue]
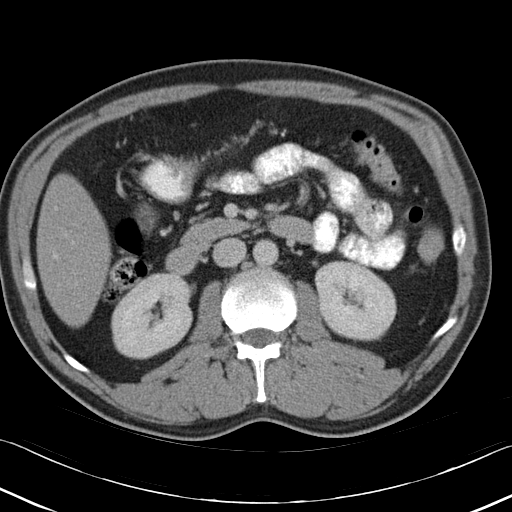
[im 64/99  bone]
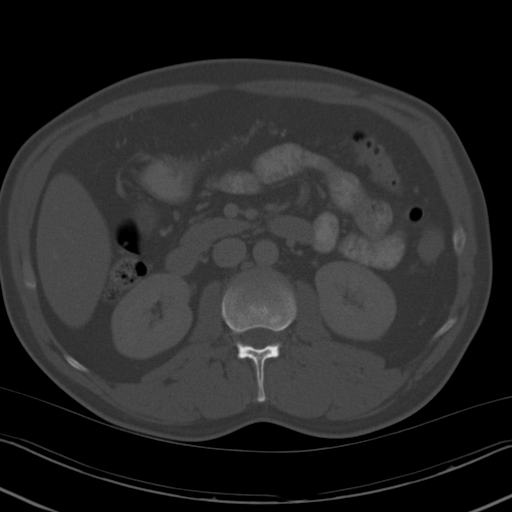
[im 69/99  soft-tissue]
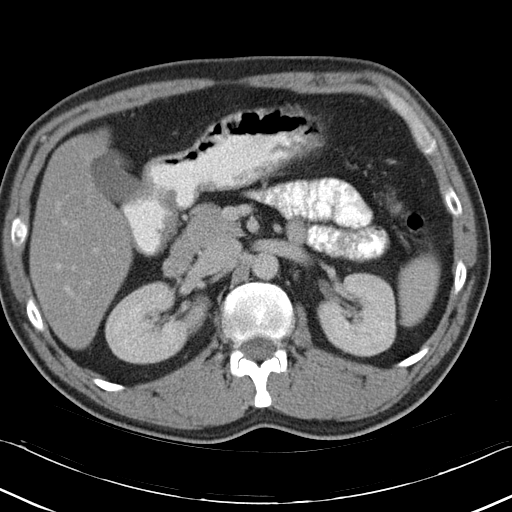
[im 79/99  soft-tissue]
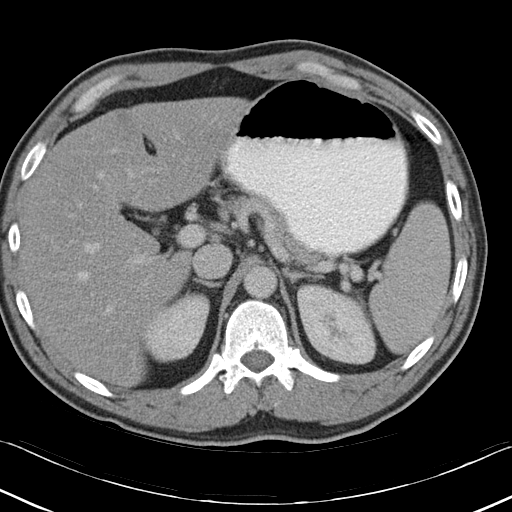
[im 84/99  soft-tissue]
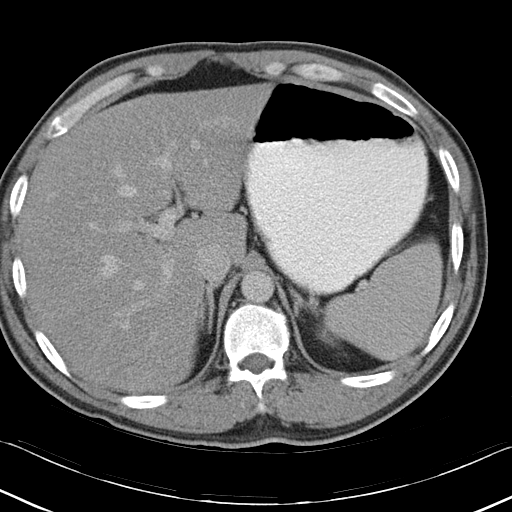
[im 94/99  soft-tissue]
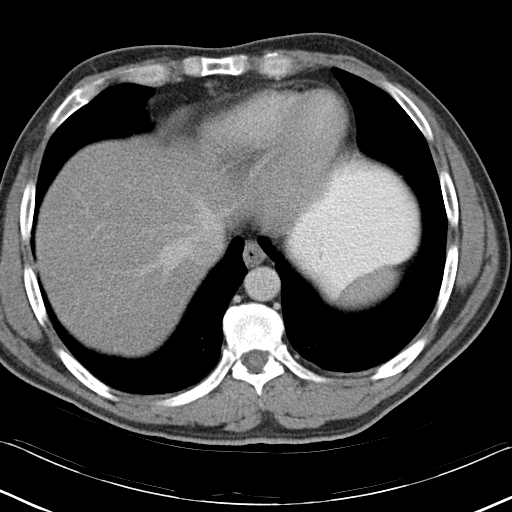

[Series 4: abd_pel_with 3.0 spo cor · coronal · 0.63mm/px · 3 of 82 slices shown]
[im 28/82  soft-tissue]
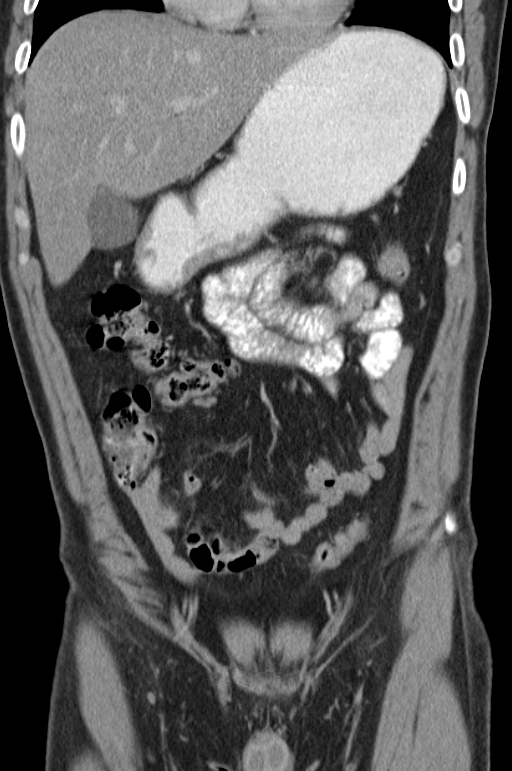
[im 37/82  soft-tissue]
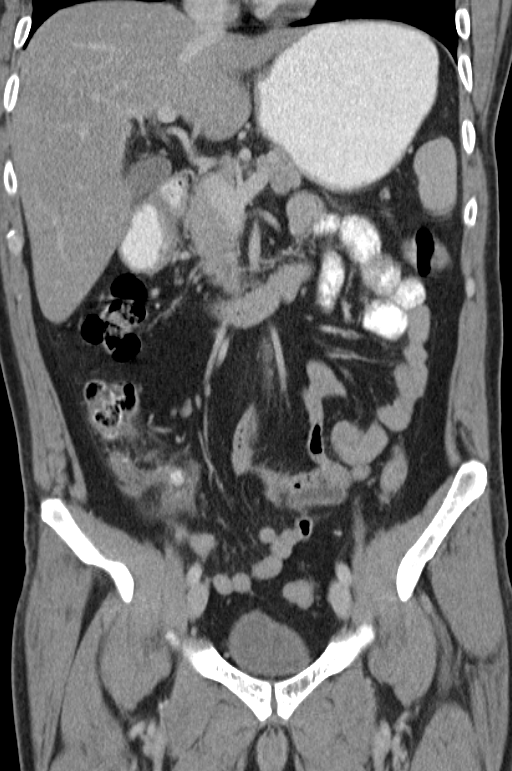
[im 46/82  soft-tissue]
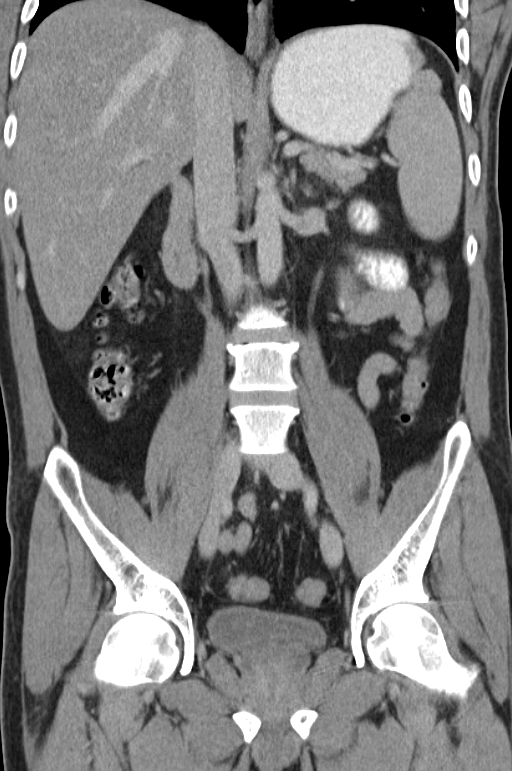

[16 of 46 positions shown; findings below may reference images not displayed]

FINDINGS: Findings consistent with appendicitis with the appendix
containing two stones measuring up to 7.4 mm with remainder the
appendix dilated with surrounding inflammation.

No free intraperitoneal air. Small amount of free fluid in the
pelvis.  No well-defined drainable abscess.

Under distended colon from the level of the distal transverse colon
to the rectum.  Evaluation of this area is limited ( with respect
to excluding colitis) but without definitive extraluminal bowel
inflammatory process referable to this region.

Fatty liver without focal hepatic lesion.  No calcified gallstones.

No focal splenic, pancreatic, adrenal or renal lesion.

Lung bases clear.

No abdominal aortic aneurysm.  No adenopathy.  No bony destructive
lesion.

Decompressed noncontrast filled urinary bladder without gross
abnormality.
IMPRESSION: Findings consistent with appendicitis as detailed above.

Critical Value/emergent results were called by telephone at the
time of interpretation on 03/13/2012 at [DATE] p.m. to Dr. Tyshawn,
who verbally acknowledged these results.

## 2017-04-02 DIAGNOSIS — M545 Low back pain: Secondary | ICD-10-CM | POA: Diagnosis not present

## 2017-06-14 DIAGNOSIS — Z125 Encounter for screening for malignant neoplasm of prostate: Secondary | ICD-10-CM | POA: Diagnosis not present

## 2017-06-14 DIAGNOSIS — Z23 Encounter for immunization: Secondary | ICD-10-CM | POA: Diagnosis not present

## 2017-06-14 DIAGNOSIS — Z0000 Encounter for general adult medical examination without abnormal findings: Secondary | ICD-10-CM | POA: Diagnosis not present

## 2017-06-22 DIAGNOSIS — R945 Abnormal results of liver function studies: Secondary | ICD-10-CM | POA: Diagnosis not present

## 2017-06-22 DIAGNOSIS — Z0000 Encounter for general adult medical examination without abnormal findings: Secondary | ICD-10-CM | POA: Diagnosis not present

## 2017-06-22 DIAGNOSIS — Z1211 Encounter for screening for malignant neoplasm of colon: Secondary | ICD-10-CM | POA: Diagnosis not present

## 2017-06-22 DIAGNOSIS — E782 Mixed hyperlipidemia: Secondary | ICD-10-CM | POA: Diagnosis not present

## 2017-06-22 DIAGNOSIS — Z23 Encounter for immunization: Secondary | ICD-10-CM | POA: Diagnosis not present

## 2017-06-23 ENCOUNTER — Encounter: Payer: Self-pay | Admitting: Gastroenterology

## 2017-08-11 ENCOUNTER — Ambulatory Visit (AMBULATORY_SURGERY_CENTER): Payer: Self-pay | Admitting: *Deleted

## 2017-08-11 ENCOUNTER — Other Ambulatory Visit: Payer: Self-pay

## 2017-08-11 VITALS — Ht 68.0 in | Wt 189.0 lb

## 2017-08-11 DIAGNOSIS — Z1211 Encounter for screening for malignant neoplasm of colon: Secondary | ICD-10-CM

## 2017-08-11 MED ORDER — NA SULFATE-K SULFATE-MG SULF 17.5-3.13-1.6 GM/177ML PO SOLN: 1.0000 | Freq: Once | ORAL | 0 refills | Status: AC

## 2017-08-11 NOTE — Progress Notes (Signed)
No egg or soy allergy known to patient  No issues with past sedation with any surgeries  or procedures, no intubation problems  No diet pills per patient No home 02 use per patient  No blood thinners per patient  Pt denies issues with constipation  No A fib or A flutter  EMMI video sent to pt's e mail - pt declined  Pt informed he will have suprep copay- online suprep coupon to pt today in [place]- wife here with pt as well

## 2017-08-23 ENCOUNTER — Telehealth: Payer: Self-pay | Admitting: Gastroenterology

## 2017-08-23 MED ORDER — NA SULFATE-K SULFATE-MG SULF 17.5-3.13-1.6 GM/177ML PO SOLN: 1.0000 | Freq: Once | ORAL | 0 refills | Status: AC

## 2017-08-23 MED ORDER — NA SULFATE-K SULFATE-MG SULF 17.5-3.13-1.6 GM/177ML PO SOLN: 1.0000 | Freq: Once | ORAL | 0 refills | Status: DC

## 2017-08-23 NOTE — Telephone Encounter (Signed)
Attempted to e scribe suprep multiple times and it continues to print- called in med to [employer] to their voice mail as directed   Lelan Pons [place]

## 2017-08-24 ENCOUNTER — Telehealth: Payer: Self-pay | Admitting: Gastroenterology

## 2017-08-24 DIAGNOSIS — M25512 Pain in left shoulder: Secondary | ICD-10-CM | POA: Diagnosis not present

## 2017-08-24 DIAGNOSIS — S29012A Strain of muscle and tendon of back wall of thorax, initial encounter: Secondary | ICD-10-CM | POA: Diagnosis not present

## 2017-08-24 NOTE — Telephone Encounter (Signed)
Patient states he did not read instructions until 2 days ago and has not done his prep correctly. Pt rescheduled tomorrow's procedure till 1.25.19

## 2017-08-24 NOTE — Telephone Encounter (Signed)
Okay thanks for letting me know

## 2017-08-25 ENCOUNTER — Encounter: Payer: Self-pay | Admitting: Gastroenterology

## 2017-09-15 ENCOUNTER — Ambulatory Visit (AMBULATORY_SURGERY_CENTER): Payer: Commercial Managed Care - PPO | Admitting: Gastroenterology

## 2017-09-15 ENCOUNTER — Other Ambulatory Visit: Payer: Self-pay

## 2017-09-15 ENCOUNTER — Encounter: Payer: Self-pay | Admitting: Gastroenterology

## 2017-09-15 VITALS — BP 110/89 | HR 60 | Temp 98.0°F | Resp 13 | Ht 68.0 in | Wt 189.0 lb

## 2017-09-15 DIAGNOSIS — D123 Benign neoplasm of transverse colon: Secondary | ICD-10-CM | POA: Diagnosis not present

## 2017-09-15 DIAGNOSIS — D120 Benign neoplasm of cecum: Secondary | ICD-10-CM

## 2017-09-15 DIAGNOSIS — D127 Benign neoplasm of rectosigmoid junction: Secondary | ICD-10-CM | POA: Diagnosis not present

## 2017-09-15 DIAGNOSIS — I10 Essential (primary) hypertension: Secondary | ICD-10-CM | POA: Diagnosis not present

## 2017-09-15 DIAGNOSIS — Z1211 Encounter for screening for malignant neoplasm of colon: Secondary | ICD-10-CM

## 2017-09-15 DIAGNOSIS — Z1212 Encounter for screening for malignant neoplasm of rectum: Secondary | ICD-10-CM

## 2017-09-15 MED ORDER — SODIUM CHLORIDE 0.9 % IV SOLN: 500.0000 mL | Freq: Once | INTRAVENOUS | Status: AC

## 2017-09-15 NOTE — Progress Notes (Signed)
To recovery, report to RN, VSS. 

## 2017-09-15 NOTE — Progress Notes (Signed)
Pt's states no medical or surgical changes since previsit or office visit. 

## 2017-09-15 NOTE — Patient Instructions (Signed)
YOU HAD AN ENDOSCOPIC PROCEDURE TODAY AT [facility]:   Refer to the procedure report that was given to you for any specific questions about what was found during the examination.  If the procedure report does not answer your questions, please call your gastroenterologist to clarify.  If you requested that your care partner not be given the details of your procedure findings, then the procedure report has been included in a sealed envelope for you to review at your convenience later.  YOU SHOULD EXPECT: Some feelings of bloating in the abdomen. Passage of more gas than usual.  Walking can help get rid of the air that was put into your GI tract during the procedure and reduce the bloating. If you had a lower endoscopy (such as a colonoscopy or flexible sigmoidoscopy) you may notice spotting of blood in your stool or on the toilet paper. If you underwent a bowel prep for your procedure, you may not have a normal bowel movement for a few days.  Please Note:  You might notice some irritation and congestion in your nose or some drainage.  This is from the oxygen used during your procedure.  There is no need for concern and it should clear up in a day or so.  SYMPTOMS TO REPORT IMMEDIATELY:   Following lower endoscopy (colonoscopy or flexible sigmoidoscopy):  Excessive amounts of blood in the stool  Significant tenderness or worsening of abdominal pains  Swelling of the abdomen that is new, acute  Fever of 100F or higher  For urgent or emergent issues, a gastroenterologist can be reached at any hour by calling [phone].   DIET:  We do recommend a small meal at first, but then you may proceed to your regular diet.  Drink plenty of fluids but you should avoid alcoholic beverages for 24 hours.  MEDICATIONS: Continue present medications. No Ibuprofen, Naproxen, or other non-steroidal anti-inflammatory drugs for 2 weeks after polyp removal.  Please see handouts given to you by your  recovery nurse.  ACTIVITY:  You should plan to take it easy for the rest of today and you should NOT DRIVE or use heavy machinery until tomorrow (because of the sedation medicines used during the test).    FOLLOW UP: Our staff will call the number listed on your records the next business day following your procedure to check on you and address any questions or concerns that you may have regarding the information given to you following your procedure. If we do not reach you, we will leave a message.  However, if you are feeling well and you are not experiencing any problems, there is no need to return our call.  We will assume that you have returned to your regular daily activities without incident.  If any biopsies were taken you will be contacted by phone or by letter within the next 1-3 weeks.  Please call us at [phone] if you have not heard about the biopsies in 3 weeks.   Thank you for allowing [place] to provide for your healthcare needs today.   SIGNATURES/CONFIDENTIALITY: You and/or your care partner have signed paperwork which will be entered into your electronic medical record.  These signatures attest to the fact that that the information above on your After Visit Summary has been reviewed and is understood.  Full responsibility of the confidentiality of this discharge information lies with you and/or your care-partner.

## 2017-09-15 NOTE — Progress Notes (Signed)
Called to room to assist during endoscopic procedure.  Patient ID and intended procedure confirmed with present staff. Received instructions for my participation in the procedure from the performing physician.  

## 2017-09-15 NOTE — Op Note (Signed)
[facility] Patient Name: Collin Hernandez Procedure Date: 09/15/2017 3:21 PM MRN: [id number] Endoscopist: Remo Lipps P. Shereda Graw MD, MD Age: 52 Referring MD:  Date of Birth: 1966-06-23 Gender: Male Account #: [id number] Procedure:                Colonoscopy Indications:              Screening for colorectal malignant neoplasm, This                            is the patient's first colonoscopy Medicines:                Monitored Anesthesia Care Procedure:                Pre-Anesthesia Assessment:                           - Prior to the procedure, a History and Physical                            was performed, and patient medications and                            allergies were reviewed. The patient's tolerance of                            previous anesthesia was also reviewed. The risks                            and benefits of the procedure and the sedation                            options and risks were discussed with the patient.                            All questions were answered, and informed consent                            was obtained. Prior Anticoagulants: The patient has                            taken no previous anticoagulant or antiplatelet                            agents. ASA Grade Assessment: II - A patient with                            mild systemic disease. After reviewing the risks                            and benefits, the patient was deemed in                            satisfactory condition to undergo the procedure.  After obtaining informed consent, the colonoscope                            was passed under direct vision. Throughout the                            procedure, the patient's blood pressure, pulse, and                            oxygen saturations were monitored continuously. The                            Colonoscope was introduced through the anus and                            advanced to the the  cecum, identified by                            appendiceal orifice and ileocecal valve. The                            colonoscopy was performed without difficulty. The                            patient tolerated the procedure well. The quality                            of the bowel preparation was good. The ileocecal                            valve, appendiceal orifice, and rectum were                            photographed. Scope In: 3:27:13 PM Scope Out: 3:48:21 PM Scope Withdrawal Time: 0 hours 18 minutes 36 seconds  Total Procedure Duration: 0 hours 21 minutes 8 seconds  Findings:                 The perianal and digital rectal examinations were                            normal.                           A 4 mm polyp was found in the ileocecal valve. The                            polyp was sessile. The polyp was removed with a                            cold snare. Resection and retrieval were complete.                           A 4 mm polyp was found in the transverse colon. The  polyp was sessile. The polyp was removed with a                            cold snare. Resection and retrieval were complete.                           A 3 mm polyp was found in the recto-sigmoid colon.                            The polyp was sessile. The polyp was removed with a                            cold snare. Resection and retrieval were complete.                           A few medium-mouthed diverticula were found in the                            sigmoid colon.                           Internal hemorrhoids were found during retroflexion.                           The exam was otherwise without abnormality. Complications:            No immediate complications. Estimated blood loss:                            Minimal. Estimated Blood Loss:     Estimated blood loss was minimal. Impression:               - One 4 mm polyp at the ileocecal valve, removed                             with a cold snare. Resected and retrieved.                           - One 4 mm polyp in the transverse colon, removed                            with a cold snare. Resected and retrieved.                           - One 3 mm polyp at the recto-sigmoid colon,                            removed with a cold snare. Resected and retrieved.                           - Diverticulosis in the sigmoid colon.                           - Internal hemorrhoids.                           -  The examination was otherwise normal. Recommendation:           - Patient has a contact number available for                            emergencies. The signs and symptoms of potential                            delayed complications were discussed with the                            patient. Return to normal activities tomorrow.                            Written discharge instructions were provided to the                            patient.                           - Resume previous diet.                           - Continue present medications.                           - Await pathology results.                           - Repeat colonoscopy is recommended for                            surveillance. The colonoscopy date will be                            determined after pathology results from today's                            exam become available for review.                           - No ibuprofen, naproxen, or other non-steroidal                            anti-inflammatory drugs for 2 weeks after polyp                            removal. Remo Lipps P. Bain Whichard MD, MD 09/15/2017 3:55:32 PM This report has been signed electronically.

## 2017-09-18 ENCOUNTER — Telehealth: Payer: Self-pay | Admitting: *Deleted

## 2017-09-18 NOTE — Telephone Encounter (Signed)
  Follow up Call-  Call back number 09/15/2017  Post procedure Call Back phone  # [phone]  Permission to leave phone message Yes  Some recent data might be hidden     Patient questions:  Do you have a fever, pain , or abdominal swelling? No. Pain Score  0 *  Have you tolerated food without any problems? Yes.    Have you been able to return to your normal activities? Yes.    Do you have any questions about your discharge instructions: Diet   No. Medications  No. Follow up visit  No.  Do you have questions or concerns about your Care? No.  Actions: * If pain score is 4 or above: No action needed, pain <4.

## 2017-09-21 ENCOUNTER — Encounter: Payer: Self-pay | Admitting: Gastroenterology

## 2017-10-27 DIAGNOSIS — T148XXA Other injury of unspecified body region, initial encounter: Secondary | ICD-10-CM | POA: Diagnosis not present

## 2017-10-27 DIAGNOSIS — S6010XA Contusion of unspecified finger with damage to nail, initial encounter: Secondary | ICD-10-CM | POA: Diagnosis not present

## 2017-10-27 DIAGNOSIS — S6990XA Unspecified injury of unspecified wrist, hand and finger(s), initial encounter: Secondary | ICD-10-CM | POA: Diagnosis not present

## 2017-10-27 DIAGNOSIS — S62607B Fracture of unspecified phalanx of left little finger, initial encounter for open fracture: Secondary | ICD-10-CM | POA: Diagnosis not present

## 2017-11-08 DIAGNOSIS — H6692 Otitis media, unspecified, left ear: Secondary | ICD-10-CM | POA: Diagnosis not present

## 2017-11-08 DIAGNOSIS — J069 Acute upper respiratory infection, unspecified: Secondary | ICD-10-CM | POA: Diagnosis not present

## 2017-11-24 DIAGNOSIS — E119 Type 2 diabetes mellitus without complications: Secondary | ICD-10-CM | POA: Diagnosis not present

## 2017-11-24 DIAGNOSIS — H524 Presbyopia: Secondary | ICD-10-CM | POA: Diagnosis not present

## 2017-11-24 DIAGNOSIS — H52203 Unspecified astigmatism, bilateral: Secondary | ICD-10-CM | POA: Diagnosis not present

## 2018-03-30 DIAGNOSIS — Z0000 Encounter for general adult medical examination without abnormal findings: Secondary | ICD-10-CM | POA: Diagnosis not present

## 2018-03-30 DIAGNOSIS — R945 Abnormal results of liver function studies: Secondary | ICD-10-CM | POA: Diagnosis not present

## 2018-03-30 DIAGNOSIS — E782 Mixed hyperlipidemia: Secondary | ICD-10-CM | POA: Diagnosis not present

## 2018-04-20 DIAGNOSIS — Z23 Encounter for immunization: Secondary | ICD-10-CM | POA: Diagnosis not present

## 2018-04-20 DIAGNOSIS — E782 Mixed hyperlipidemia: Secondary | ICD-10-CM | POA: Diagnosis not present

## 2018-04-20 DIAGNOSIS — Z6828 Body mass index (BMI) 28.0-28.9, adult: Secondary | ICD-10-CM | POA: Diagnosis not present

## 2018-08-09 DIAGNOSIS — E782 Mixed hyperlipidemia: Secondary | ICD-10-CM | POA: Diagnosis not present

## 2018-08-09 DIAGNOSIS — R739 Hyperglycemia, unspecified: Secondary | ICD-10-CM | POA: Diagnosis not present

## 2018-08-31 DIAGNOSIS — H9312 Tinnitus, left ear: Secondary | ICD-10-CM | POA: Diagnosis not present

## 2018-08-31 DIAGNOSIS — E782 Mixed hyperlipidemia: Secondary | ICD-10-CM | POA: Diagnosis not present

## 2018-08-31 DIAGNOSIS — R739 Hyperglycemia, unspecified: Secondary | ICD-10-CM | POA: Diagnosis not present

## 2022-09-05 ENCOUNTER — Encounter: Payer: Self-pay | Admitting: Gastroenterology

## 2023-04-14 ENCOUNTER — Ambulatory Visit
Admission: EM | Admit: 2023-04-14 | Discharge: 2023-04-14 | Disposition: A | Discharge status: Home / Self Care | Payer: BLUE CROSS/BLUE SHIELD | Attending: Family Medicine | Admitting: Family Medicine

## 2023-04-14 ENCOUNTER — Encounter: Payer: Self-pay | Admitting: Emergency Medicine

## 2023-04-14 DIAGNOSIS — M25562 Pain in left knee: Secondary | ICD-10-CM

## 2023-04-14 MED ORDER — PREDNISONE 20 MG PO TABS: 40.0000 mg | ORAL_TABLET | Freq: Every day | ORAL | 0 refills | Status: AC

## 2023-04-14 NOTE — ED Provider Notes (Signed)
[facility]    CSN: [id number] Arrival date & time: 04/14/23  1819      History   Chief Complaint No chief complaint on file.   HPI Collin Hernandez is a 57 y.o. male.   Patient presenting today with 1 day history of acute on chronic left knee pain.  States the knee has been hurting for over a month but made much worse yesterday after doing a lot of kneeling and bending at work.  Denies swelling, discoloration, decreased range of motion, direct injury to the area, leg numbness tingling or weakness.  So far trying ibuprofen this morning with no relief.    Past Medical History:  Diagnosis Date   Diabetes mellitus    diet controlled    GERD (gastroesophageal reflux disease)    Hyperlipidemia    Hypertension     There are no problems to display for this patient.   Past Surgical History:  Procedure Laterality Date   APPENDECTOMY     HAND SURGERY Left    LAPAROSCOPIC APPENDECTOMY  03/13/2012   Procedure: APPENDECTOMY LAPAROSCOPIC;  Surgeon: Dalia Heading, MD;  Location: [facility];  Service: General;  Laterality: N/A;       Home Medications    Prior to Admission medications   Medication Sig Start Date End Date Taking? Authorizing Provider  predniSONE (DELTASONE) 20 MG tablet Take 2 tablets (40 mg total) by mouth daily with breakfast. 04/14/23  Yes Particia Nearing, PA-C    Family History Family History  Problem Relation Age of Onset   Colon cancer Neg Hx    Colon polyps Neg Hx    Rectal cancer Neg Hx    Stomach cancer Neg Hx     Social History Social History   Tobacco Use   Smoking status: Never   Smokeless tobacco: Never  Vaping Use   Vaping status: Never Used  Substance Use Topics   Alcohol use: Yes    Comment: occasionally   Drug use: No     Allergies   Patient has no known allergies.   Review of Systems Review of Systems Per HPI  Physical Exam Triage Vital Signs ED Triage Vitals  Encounter Vitals Group     BP 04/14/23 1838  (!) 149/82     Systolic BP Percentile --      Diastolic BP Percentile --      Pulse Rate 04/14/23 1838 71     Resp 04/14/23 1838 18     Temp 04/14/23 1838 98 F (36.7 C)     Temp Source 04/14/23 1838 Oral     SpO2 04/14/23 1838 96 %     Weight --      Height --      Head Circumference --      Peak Flow --      Pain Score 04/14/23 1839 7     Pain Loc --      Pain Education --      Exclude from Growth Chart --    No data found.  Updated Vital Signs BP (!) 149/82 (BP Location: Right Arm)   Pulse 71   Temp 98 F (36.7 C) (Oral)   Resp 18   SpO2 96%   Visual Acuity Right Eye Distance:   Left Eye Distance:   Bilateral Distance:    Right Eye Near:   Left Eye Near:    Bilateral Near:     Physical Exam Vitals and nursing note reviewed.  Constitutional:  Appearance: Normal appearance.  HENT:     Head: Atraumatic.  Eyes:     Extraocular Movements: Extraocular movements intact.     Conjunctiva/sclera: Conjunctivae normal.  Cardiovascular:     Rate and Rhythm: Normal rate and regular rhythm.  Pulmonary:     Effort: Pulmonary effort is normal.     Breath sounds: Normal breath sounds.  Musculoskeletal:        General: Tenderness present. No swelling or signs of injury. Normal range of motion.     Cervical back: Normal range of motion and neck supple.     Comments: Bilateral joint lines of the left knee tender to palpation without deformity, range of motion intact, no joint instability on exam.  Negative McMurray's and drawer testing.  Skin:    General: Skin is warm and dry.     Findings: No bruising or erythema.  Neurological:     General: No focal deficit present.     Mental Status: He is oriented to person, place, and time.     Comments: Left lower extremity neurovascularly intact  Psychiatric:        Mood and Affect: Mood normal.        Thought Content: Thought content normal.        Judgment: Judgment normal.      UC Treatments / Results  Labs (all  labs ordered are listed, but only abnormal results are displayed) Labs Reviewed - No data to display  EKG   Radiology No results found.  Procedures Procedures (including critical care time)  Medications Ordered in UC Medications - No data to display  Initial Impression / Assessment and Plan / UC Course  I have reviewed the triage vital signs and the nursing notes.  Pertinent labs & imaging results that were available during my care of the patient were reviewed by me and considered in my medical decision making (see chart for details).     Suspect inflammatory/arthritic cause.  Will forego x-ray imaging with shared decision making today as range of motion is intact and no direct traumatic injury.  Treat with prednisone, RICE protocol, over-the-counter pain relievers.  Return for worsening symptoms.  Final Clinical Impressions(s) / UC Diagnoses   Final diagnoses:  Acute pain of left knee   Discharge Instructions   None    ED Prescriptions     Medication Sig Dispense Auth. Provider   predniSONE (DELTASONE) 20 MG tablet Take 2 tablets (40 mg total) by mouth daily with breakfast. 10 tablet Particia Nearing, [place]      PDMP not reviewed this encounter.   Particia Nearing, [place] 04/14/23 1945

## 2023-04-14 NOTE — ED Triage Notes (Signed)
Left knee pain since yesterday.  States he thinks he twisted his knee at work yesterday.  States knee has been hurting over a month and he made it worse yesterday.

## 2023-05-02 ENCOUNTER — Ambulatory Visit: Payer: BLUE CROSS/BLUE SHIELD | Admitting: Physician Assistant

## 2023-05-02 ENCOUNTER — Encounter: Payer: Self-pay | Admitting: Physician Assistant

## 2023-05-02 ENCOUNTER — Other Ambulatory Visit (INDEPENDENT_AMBULATORY_CARE_PROVIDER_SITE_OTHER): Payer: BLUE CROSS/BLUE SHIELD

## 2023-05-02 DIAGNOSIS — M25562 Pain in left knee: Secondary | ICD-10-CM | POA: Diagnosis not present

## 2023-05-02 MED ORDER — METHYLPREDNISOLONE ACETATE 40 MG/ML IJ SUSP
80.0000 mg | INTRAMUSCULAR | Status: AC | PRN
Start: 2023-05-02 — End: 2023-05-02
  Administered 2023-05-02: 80 mg via INTRA_ARTICULAR

## 2023-05-02 MED ORDER — LIDOCAINE HCL 1 % IJ SOLN
2.0000 mL | INTRAMUSCULAR | Status: AC | PRN
Start: 2023-05-02 — End: 2023-05-02
  Administered 2023-05-02: 2 mL

## 2023-05-02 MED ORDER — MELOXICAM 7.5 MG PO TABS: 7.5000 mg | ORAL_TABLET | Freq: Every day | ORAL | 0 refills | Status: AC

## 2023-05-02 MED ORDER — BUPIVACAINE HCL 0.25 % IJ SOLN
2.0000 mL | INTRAMUSCULAR | Status: AC | PRN
Start: 2023-05-02 — End: 2023-05-02
  Administered 2023-05-02: 2 mL via INTRA_ARTICULAR

## 2023-05-02 NOTE — Progress Notes (Signed)
Office Visit Note   Patient: Collin Hernandez           Date of Birth: 1966/03/16           MRN: [id number] Visit Date: 05/02/2023              Requested by: Lewis Moccasin, MD [address] [place],  [place] [address] PCP: Lewis Moccasin, MD   Assessment & Plan: Visit Diagnoses:  1. Acute pain of left knee     Plan: Pleasant 57 year old gentleman who works in [employer] does a lot of kneeling and crawling for work.  Has a 2-1/2-week history of left medial knee pain.  No particular event.  He was in [place] for a couple weeks denies any fever chills or erythema in his knee.  Finds that when he bends down he has acute pain on the medial side of his knee.  Examination today does not demonstrate anything consistent with an infection he does not have any erythema no warmth no effusion.  Could have a meniscus tear versus just some early arthritis.  Discussed with him going on a regular course of meloxicam.  Should not take ibuprofen with this can take Tylenol in between.  He has taken ibuprofen before without difficulty.  Also continue to use topical medication.  Talked him about trying a steroid injection today.  Will follow-up with me in 3 weeks for reevaluation  Follow-Up Instructions: Return in about 3 weeks (around 05/23/2023).   Orders:  Orders Placed This Encounter  Procedures  . XR KNEE 3 VIEW LEFT   Meds ordered this encounter  Medications  . meloxicam (MOBIC) 7.5 MG tablet    Sig: Take 1 tablet (7.5 mg total) by mouth daily.    Dispense:  30 tablet    Refill:  0      Procedures: Large Joint Inj: L knee on 05/02/2023 9:57 AM Indications: pain and diagnostic evaluation Details: 25 G 1.5 in needle, anteromedial approach  Arthrogram: No  Medications: 80 mg methylPREDNISolone acetate 40 MG/ML; 2 mL lidocaine 1 %; 2 mL bupivacaine 0.25 % Outcome: tolerated well, no immediate complications Procedure, treatment alternatives, risks and benefits explained, specific risks discussed.  Consent was given by the patient.     Clinical Data: No additional findings.   Subjective: No chief complaint on file.   HPI  Review of Systems  All other systems reviewed and are negative.    Objective: Vital Signs: There were no vitals taken for this visit.  Physical Exam Constitutional:      Appearance: Normal appearance.  Skin:    General: Skin is warm and dry.  Neurological:     Mental Status: He is alert.  Psychiatric:        Mood and Affect: Mood normal.    Ortho Exam [place] of his left knee he has no erythema no effusion negative Homans' sign neurovascularly intact.  Compartments are soft compressible no cellulitis.  He has full active extension and flexion.  He does have reproducible pain on flexion.  No pain with terminal extension he has good varus valgus anterior posterior stability no noted prepatellar bursitis skin is in good condition Specialty Comments:  No specialty comments available.  Imaging: XR KNEE 3 VIEW LEFT  Result Date: 05/02/2023 Radiographs of his left knee were reviewed today well-maintained alignment no acute fractures or changes noted minimal degenerative changes    PMFS History: Patient Active Problem List   Diagnosis Date Noted  . Pain in left  knee 05/02/2023   Past Medical History:  Diagnosis Date  . Diabetes mellitus    diet controlled   . GERD (gastroesophageal reflux disease)   . Hyperlipidemia   . Hypertension     Family History  Problem Relation Age of Onset  . Colon cancer Neg Hx   . Colon polyps Neg Hx   . Rectal cancer Neg Hx   . Stomach cancer Neg Hx     Past Surgical History:  Procedure Laterality Date  . APPENDECTOMY    . HAND SURGERY Left   . LAPAROSCOPIC APPENDECTOMY  03/13/2012   Procedure: APPENDECTOMY LAPAROSCOPIC;  Surgeon: Dalia Heading, MD;  Location: [facility];  Service: General;  Laterality: N/A;   Social History   Occupational History  . Not on file  Tobacco Use  . Smoking status: Never   . Smokeless tobacco: Never  Vaping Use  . Vaping status: Never Used  Substance and Sexual Activity  . Alcohol use: Yes    Comment: occasionally  . Drug use: No  . Sexual activity: Not on file

## 2023-05-23 ENCOUNTER — Encounter: Payer: Self-pay | Admitting: Physician Assistant

## 2023-05-23 ENCOUNTER — Ambulatory Visit: Payer: BLUE CROSS/BLUE SHIELD | Admitting: Physician Assistant

## 2023-05-23 DIAGNOSIS — M25562 Pain in left knee: Secondary | ICD-10-CM | POA: Diagnosis not present

## 2023-05-23 NOTE — Progress Notes (Signed)
Office Visit Note   Patient: Collin Hernandez           Date of Birth: 1965-11-19           MRN: [id number] Visit Date: 05/23/2023              Requested by: Lewis Moccasin, MD [address] [place],  [place] [address] PCP: Lewis Moccasin, MD  Chief Complaint  Patient presents with   Left Knee - Follow-up      HPI: Patient is a pleasant 57 year old gentleman who now has a greater than 6-week history of left knee pain.  No particular injury but works in a job that requires getting up and down and kneeling.  I did give him an injection at his last visit gave him minimal relief.  He does report mechanical signs on the medial side of his knee including locking and catching especially when he has been kneeling and goes to get up from a kneeling position  Assessment & Plan: Visit Diagnoses:  1. Acute pain of left knee     Plan: Left knee pain that is been persistent greater than 6 weeks with associated mechanical systems.  Was not relieved by steroid injection and anti-inflammatories.  Concerns for an meniscus tear.  His x-rays were taken do not show significant degenerative changes we will recommend an MRI based on the results of the MRI we will follow-up with him or have him follow-up with one of our surgeons  Follow-Up Instructions: Return in about 4 weeks (around 06/20/2023).   Ortho Exam  Patient is alert, oriented, no adenopathy, well-dressed, normal affect, normal respiratory effort. Left knee no effusion no erythema compartments are soft and nontender he does have tenderness over the medial joint line.  Pain is accentuated with terminal flexion he is neurovascularly intact,  Imaging: No results found. No images are attached to the encounter.  Labs: No results found for: "HGBA1C", "ESRSEDRATE", "CRP", "LABURIC", "REPTSTATUS", "GRAMSTAIN", "CULT", "LABORGA"   Lab Results  Component Value Date   ALBUMIN 4.5 03/13/2012    No results found for: "MG" No results found  for: "VD25OH"  No results found for: "PREALBUMIN"    Latest Ref Rng & Units 03/14/2012    5:23 AM 03/13/2012    5:40 PM  CBC EXTENDED  WBC 4.0 - 10.5 K/uL 9.9  15.9   RBC 4.22 - 5.81 MIL/uL 4.37  4.81   Hemoglobin 13.0 - 17.0 g/dL 19.1  47.8   HCT 29.5 - 52.0 % 38.2  41.3   Platelets 150 - 400 K/uL 216  237      There is no height or weight on file to calculate BMI.  Orders:  Orders Placed This Encounter  Procedures   MR Knee Left w/o contrast   No orders of the defined types were placed in this encounter.    Procedures: No procedures performed  Clinical Data: No additional findings.  ROS:  All other systems negative, except as noted in the HPI. Review of Systems  Objective: Vital Signs: There were no vitals taken for this visit.  Specialty Comments:  No specialty comments available.  PMFS History: Patient Active Problem List   Diagnosis Date Noted   Pain in left knee 05/02/2023   Past Medical History:  Diagnosis Date   Diabetes mellitus    diet controlled    GERD (gastroesophageal reflux disease)    Hyperlipidemia    Hypertension     Family History  Problem Relation Age of  Onset   Colon cancer Neg Hx    Colon polyps Neg Hx    Rectal cancer Neg Hx    Stomach cancer Neg Hx     Past Surgical History:  Procedure Laterality Date   APPENDECTOMY     HAND SURGERY Left    LAPAROSCOPIC APPENDECTOMY  03/13/2012   Procedure: APPENDECTOMY LAPAROSCOPIC;  Surgeon: Dalia Heading, MD;  Location: [facility];  Service: General;  Laterality: N/A;   Social History   Occupational History   Not on file  Tobacco Use   Smoking status: Never   Smokeless tobacco: Never  Vaping Use   Vaping status: Never Used  Substance and Sexual Activity   Alcohol use: Yes    Comment: occasionally   Drug use: No   Sexual activity: Not on file
